# Patient Record
Sex: Male | Born: 2000 | Race: Black or African American | Hispanic: No | Marital: Single | Smoking: Never smoker
Health system: Southern US, Community
[De-identification: ages and names within clinical notes are randomized; demographics above are authoritative.]

---
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
## 2022-08-08 ENCOUNTER — Telehealth: Payer: Self-pay

## 2022-08-08 NOTE — Telephone Encounter (Signed)
LVM for patient to call back. AS< CMA
